# Patient Record
Sex: Female | Born: 1980 | Race: Black or African American | Hispanic: No | Marital: Single | Smoking: Current every day smoker
Health system: Southern US, Community
[De-identification: ages and names within clinical notes are randomized; demographics above are authoritative.]

## PROBLEM LIST (undated history)

## (undated) DIAGNOSIS — B20 Human immunodeficiency virus [HIV] disease: Secondary | ICD-10-CM

## (undated) DIAGNOSIS — D849 Immunodeficiency, unspecified: Secondary | ICD-10-CM

---

## 2006-05-18 ENCOUNTER — Emergency Department (HOSPITAL_COMMUNITY): Admission: EM | Admit: 2006-05-18 | Discharge: 2006-05-18 | Payer: Self-pay | Admitting: Emergency Medicine

## 2009-05-06 ENCOUNTER — Emergency Department (HOSPITAL_COMMUNITY): Admission: EM | Admit: 2009-05-06 | Discharge: 2009-05-07 | Payer: Self-pay | Admitting: Emergency Medicine

## 2009-11-05 ENCOUNTER — Emergency Department (HOSPITAL_COMMUNITY): Admission: EM | Admit: 2009-11-05 | Discharge: 2009-11-05 | Payer: Self-pay | Admitting: Emergency Medicine

## 2011-03-26 ENCOUNTER — Emergency Department (HOSPITAL_COMMUNITY)
Admission: EM | Admit: 2011-03-26 | Discharge: 2011-03-26 | Disposition: A | Discharge status: Home / Self Care | Payer: Self-pay | Attending: Emergency Medicine | Admitting: Emergency Medicine

## 2011-03-26 DIAGNOSIS — K120 Recurrent oral aphthae: Secondary | ICD-10-CM | POA: Insufficient documentation

## 2011-03-26 DIAGNOSIS — Z21 Asymptomatic human immunodeficiency virus [HIV] infection status: Secondary | ICD-10-CM | POA: Insufficient documentation

## 2011-03-26 DIAGNOSIS — L299 Pruritus, unspecified: Secondary | ICD-10-CM | POA: Insufficient documentation

## 2011-03-26 DIAGNOSIS — R21 Rash and other nonspecific skin eruption: Secondary | ICD-10-CM | POA: Insufficient documentation

## 2011-03-26 DIAGNOSIS — R109 Unspecified abdominal pain: Secondary | ICD-10-CM | POA: Insufficient documentation

## 2014-06-22 ENCOUNTER — Encounter (HOSPITAL_COMMUNITY): Payer: Self-pay | Admitting: Emergency Medicine

## 2014-06-22 ENCOUNTER — Emergency Department (HOSPITAL_COMMUNITY): Payer: Self-pay

## 2014-06-22 ENCOUNTER — Emergency Department (HOSPITAL_COMMUNITY)
Admission: EM | Admit: 2014-06-22 | Discharge: 2014-06-22 | Disposition: A | Discharge status: Home / Self Care | Payer: Self-pay | Attending: Emergency Medicine | Admitting: Emergency Medicine

## 2014-06-22 DIAGNOSIS — R05 Cough: Secondary | ICD-10-CM | POA: Insufficient documentation

## 2014-06-22 DIAGNOSIS — R112 Nausea with vomiting, unspecified: Secondary | ICD-10-CM | POA: Insufficient documentation

## 2014-06-22 DIAGNOSIS — Z79899 Other long term (current) drug therapy: Secondary | ICD-10-CM | POA: Insufficient documentation

## 2014-06-22 DIAGNOSIS — M25532 Pain in left wrist: Secondary | ICD-10-CM | POA: Insufficient documentation

## 2014-06-22 DIAGNOSIS — Z792 Long term (current) use of antibiotics: Secondary | ICD-10-CM | POA: Insufficient documentation

## 2014-06-22 DIAGNOSIS — Z720 Tobacco use: Secondary | ICD-10-CM | POA: Insufficient documentation

## 2014-06-22 DIAGNOSIS — M79672 Pain in left foot: Secondary | ICD-10-CM | POA: Insufficient documentation

## 2014-06-22 DIAGNOSIS — B20 Human immunodeficiency virus [HIV] disease: Secondary | ICD-10-CM | POA: Insufficient documentation

## 2014-06-22 HISTORY — DX: Human immunodeficiency virus (HIV) disease: B20

## 2014-06-22 HISTORY — DX: Immunodeficiency, unspecified: D84.9

## 2014-06-22 MED ORDER — HYDROCODONE-ACETAMINOPHEN 5-325 MG PO TABS
2.0000 | ORAL_TABLET | Freq: Once | ORAL | Status: AC
  Administered 2014-06-22: 2 via ORAL
  Filled 2014-06-22: qty 2

## 2014-06-22 MED ORDER — HYDROCODONE-ACETAMINOPHEN 5-325 MG PO TABS: 1.0000 | ORAL_TABLET | Freq: Four times a day (QID) | ORAL | Status: AC | PRN

## 2014-06-22 MED ORDER — HYDROCODONE-ACETAMINOPHEN 5-325 MG PO TABS: 1.0000 | ORAL_TABLET | ORAL | Status: DC | PRN

## 2014-06-22 NOTE — Discharge Instructions (Signed)
Arthralgia °Your caregiver has diagnosed you as suffering from an arthralgia. Arthralgia means there is pain in a joint. This can come from many reasons including: °· Bruising the joint which causes soreness (inflammation) in the joint. °· Wear and tear on the joints which occur as we grow older (osteoarthritis). °· Overusing the joint. °· Various forms of arthritis. °· Infections of the joint. °Regardless of the cause of pain in your joint, most of these different pains respond to anti-inflammatory drugs and rest. The exception to this is when a joint is infected, and these cases are treated with antibiotics, if it is a bacterial infection. °HOME CARE INSTRUCTIONS  °· Rest the injured area for as long as directed by your caregiver. Then slowly start using the joint as directed by your caregiver and as the pain allows. Crutches as directed may be useful if the ankles, knees or hips are involved. If the knee was splinted or casted, continue use and care as directed. If an stretchy or elastic wrapping bandage has been applied today, it should be removed and re-applied every 3 to 4 hours. It should not be applied tightly, but firmly enough to keep swelling down. Watch toes and feet for swelling, bluish discoloration, coldness, numbness or excessive pain. If any of these problems (symptoms) occur, remove the ace bandage and re-apply more loosely. If these symptoms persist, contact your caregiver or return to this location. °· For the first 24 hours, keep the injured extremity elevated on pillows while lying down. °· Apply ice for 15-20 minutes to the sore joint every couple hours while awake for the first half day. Then 03-04 times per day for the first 48 hours. Put the ice in a plastic bag and place a towel between the bag of ice and your skin. °· Wear any splinting, casting, elastic bandage applications, or slings as instructed. °· Only take over-the-counter or prescription medicines for pain, discomfort, or fever as  directed by your caregiver. Do not use aspirin immediately after the injury unless instructed by your physician. Aspirin can cause increased bleeding and bruising of the tissues. °· If you were given crutches, continue to use them as instructed and do not resume weight bearing on the sore joint until instructed. °Persistent pain and inability to use the sore joint as directed for more than 2 to 3 days are warning signs indicating that you should see a caregiver for a follow-up visit as soon as possible. Initially, a hairline fracture (break in bone) may not be evident on X-rays. Persistent pain and swelling indicate that further evaluation, non-weight bearing or use of the joint (use of crutches or slings as instructed), or further X-rays are indicated. X-rays may sometimes not show a small fracture until a week or 10 days later. Make a follow-up appointment with your own caregiver or one to whom we have referred you. A radiologist (specialist in reading X-rays) may read your X-rays. Make sure you know how you are to obtain your X-ray results. Do not assume everything is normal if you do not hear from us. °SEEK MEDICAL CARE IF: °Bruising, swelling, or pain increases. °SEEK IMMEDIATE MEDICAL CARE IF:  °· Your fingers or toes are numb or blue. °· The pain is not responding to medications and continues to stay the same or get worse. °· The pain in your joint becomes severe. °· You develop a fever over 102° F (38.9° C). °· It becomes impossible to move or use the joint. °MAKE SURE YOU:  °·   Understand these instructions.  Will watch your condition.  Will get help right away if you are not doing well or get worse. Document Released: 08/10/2005 Document Revised: 11/02/2011 Document Reviewed: 03/28/2008 [facility] Patient Information 2015 [place], [place]. This information is not intended to replace advice given to you by your health care provider. Make sure you discuss any questions you have with your health care  provider.  AIDS AIDS (Acquired Immune Deficiency Syndrome) is a severe viral infection caused by the Human Immunodeficiency Virus (HIV). This virus destroys a person's resistance to disease and certain cancers. It is transmitted through blood, blood products, and body fluids. Although the fear of AIDS has grown faster than the epidemic, it is important to note that AIDS is not spread by casual contact and is easily killed by hot water, soap, bleach, and most antiseptics. HOW THE AIDS INFECTION WORKS Once HIV enters the body it affects T-helper (T-4) lymphocytes. These are white blood cells that are crucial for the immune system. Once they become infected, they become factories for producing the AIDS Virus. Eventually these infected T-4 cells die. This leaves the victim susceptible to infection and certain cancers. While anyone can get AIDS, you are unlikely to get this disease unless you indulge in high-risk behavior. Some of these high-risk behaviors are promiscuous sex, having close relationships with HIV-positive people, and the sharing of needles. This illness was initially more common in homosexual men, but as time progresses, it will most probably affect an equal number of men and women. Babies born to women who are infected, have greater chances of getting AIDS. Infection with HIV may cause a brief, mild illness with fever several weeks after contact. More serious symptoms do not develop until months or years later, so a person can be infected with the virus without showing any symptoms at all. At this stage of the infection there is no way of knowing you are infected unless you have a blood or mouth scraping test for the AIDS virus. SYMPTOMS   Fevers, night sweats, general weakness, enlarged lymph nodes.  Chest pain, pneumonia, chronic cough, shortness of breath.  Weight loss, diarrhea, difficulty swallowing, rectal problems.  Headaches, personality changes, problems with vision and  memory.  Skin tumors (patchy dark areas) and infections. There is no cure or vaccine for AIDS at the present time. Anti-viral antibiotic drugs have been shown to stop the virus from multiplying, which helps prolong health. The best treatment against AIDS is prevention. If you have been infected with HIV, careful medical follow-up with regular blood tests is necessary.  Do not take part in risky behaviors. These include sharing needles and syringes or other sharp instruments like razors with others, having unprotected sex with high-risk people (homosexuals, bisexuals, or prostitutes), and engaging in anal sex (with or without a condom).  For further information about AIDS, please call your caregiver, the health department, or the Center for Disease Control: 800-342-AIDS. MISCONCEPTIONS ABOUT AIDS  You can not get AIDS through casual contact. This includes sitting next to an AIDS infected person, being coughed on, living with, swimming with, eating food prepared by, sitting or lying next to someone with AIDS.  It is not caught from toilet seats, from showers, bath tubs, water fountains, phones, drinking glasses, or food touched or used by people with AIDS. Casual kissing will probably not transmit the disease. "[place] kissing" (putting one's tongue in another's mouth) is probably not a good idea as the AIDS Virus is present in saliva.  You  will not get AIDS by donating blood. The needles used by blood banks are sterile and disposable.  There is no evidence that AIDS is transmitted through tears.  AIDS is not caught through mosquitoes.  Children with AIDS will not pass AIDS to other children in school without exchange of blood products or engaging in sex. In school, a child with AIDS is actually at greater risk because of their weak immune status. Their susceptibility to the viruses and germs (bacteria) carried by children without AIDS is great. TESTING FOR AIDS  An ELISA (enzyme-linked  immunoabsorbent assay) is a blood test available to let you know if you have contracted AIDS. If this test is positive, it is usually repeated.  If positive a second time, a second test known as the Western blot test is performed. If the Western blot test is positive, it means you have been infected with HIV. PREVENTION  The best way to prevent AIDS is to avoid high-risk behavior.  The outlook for defeating AIDS is good. Millions of research dollars are being spent on creating a vaccine to prevent the disease as well as providing a cure. New drugs appear to be extremely effective at controlling the disease.  Your caregiver will educate you in all the most effective and current treatments. Document Released: 08/07/2000 Document Revised: 11/02/2011 Document Reviewed: 08/03/2008 [facility] Patient Information 2015 [place], [place]. This information is not intended to replace advice given to you by your health care provider. Make sure you discuss any questions you have with your health care provider.

## 2014-06-22 NOTE — ED Notes (Signed)
Bed: [id number] Expected date: 06/22/14 Expected time:  Means of arrival:  Comments: EMS

## 2014-06-22 NOTE — ED Provider Notes (Signed)
CSN: [id number]     Arrival date & time 06/22/14  1739 History   First MD Initiated Contact with Patient 06/22/14 1803     Chief Complaint  Patient presents with  . Generalized Body Aches  . Recurrent Skin Infections     (Consider location/radiation/quality/duration/timing/severity/associated sxs/prior Treatment) HPI Kirsten Russell is a [age] year old female with past medical history of HIV/AIDS who presents to the ER with 3 days of generalized body aches, nausea, vomiting, headache, mildly productive cough. Patient states her symptoms began gradually approximately 3 days ago, and have since worsened. Patient states she has tried ibuprofen 1500 mg every 12 hours with minimal relief. Patient denies having fever, dizziness, syncope, chest pain, shortness of breath, abdominal pain, diarrhea, dysuria. Patient states she was off of antiretrovirals for "several months, and just began taking them one week ago. Patient states her nausea lasted only yesterday, and she vomited times one. Patient states her nausea has improved since then. Patient states her last follow-up with ID was approximately one month ago, and she is unaware of her CD4 or viral load at that time. Patient denies any sexual contacts of any kind within the past "several years". Patient states she has never experienced these symptoms in the past.  Past Medical History  Diagnosis Date  . Immune deficiency disorder   . AIDS    History reviewed. No pertinent past surgical history. No family history on file. History  Substance Use Topics  . Smoking status: Current Every Day Smoker -- 0.50 packs/day    Types: Cigarettes  . Smokeless tobacco: Not on file  . Alcohol Use: Yes     Comment: occasionally   OB History   Grav Para Term Preterm Abortions TAB SAB Ect Mult Living                 Review of Systems  Constitutional: Negative for fever.  HENT: Negative for trouble swallowing.   Eyes: Negative for visual disturbance.   Respiratory: Positive for cough. Negative for shortness of breath.   Cardiovascular: Negative for chest pain.  Gastrointestinal: Positive for nausea and vomiting. Negative for abdominal pain.  Genitourinary: Negative for dysuria.  Musculoskeletal: Negative for neck pain.  Skin: Negative for rash.  Neurological: Negative for dizziness, weakness and numbness.  Psychiatric/Behavioral: Negative.       Allergies  Review of patient's allergies indicates no known allergies.  Home Medications   Prior to Admission medications   Medication Sig Start Date End Date Taking? Authorizing Provider  elvitegravir-cobicistat-emtricitabine-tenofovir (STRIBILD) 150-150-200-300 MG TABS tablet Take 1 tablet by mouth daily with breakfast.   Yes Historical Provider, MD  IBUPROFEN PO Take 1,500 mg by mouth daily as needed (pain).   Yes Historical Provider, MD  azithromycin (ZITHROMAX) 600 MG tablet Take 600 mg by mouth 2 (two) times a week.    Historical Provider, MD  HYDROcodone-acetaminophen (NORCO/VICODIN) 5-325 MG per tablet Take 1 tablet by mouth every 6 (six) hours as needed for moderate pain or severe pain. 06/22/14   Kirsten FantasiaJoseph W Mavis Gravelle, PA-C   BP 110/57  Pulse 78  Temp(Src) 98.9 F (37.2 C) (Oral)  Resp 15  SpO2 98% Physical Exam  Nursing note and vitals reviewed. Constitutional: She is oriented to person, place, and time. She appears well-developed and well-nourished.  Non-toxic appearance. No distress.  Patient uncooperative during the entire exam.  HENT:  Head: Normocephalic and atraumatic.  Mouth/Throat: Oropharynx is clear and moist. No oropharyngeal exudate.  Eyes: Right eye exhibits no discharge. Left eye  exhibits no discharge. No scleral icterus.  Neck: Normal range of motion.  Cardiovascular: Normal rate, regular rhythm and normal heart sounds.   No murmur heard. Pulmonary/Chest: Effort normal and breath sounds normal. No accessory muscle usage. Not tachypneic. No respiratory distress.   Abdominal: Soft. Normal appearance and bowel sounds are normal. There is no tenderness. There is no rigidity, no guarding, no tenderness at McBurney's point and negative Murphy's sign.  Musculoskeletal: Normal range of motion. She exhibits no edema and no tenderness.  Neurological: She is alert and oriented to person, place, and time. No cranial nerve deficit. Coordination normal.  Skin: Skin is warm and dry. No rash noted. She is not diaphoretic.  Psychiatric: She has a normal mood and affect.    ED Course  Procedures (including critical care time) Labs Review Labs Reviewed - No data to display  Imaging Review Dg Wrist Complete Left  06/22/2014   CLINICAL DATA:  Left wrist pain  EXAM: LEFT WRIST - COMPLETE 3+ VIEW  COMPARISON:  None.  FINDINGS: There is no evidence of fracture or dislocation. There is no evidence of arthropathy or other focal bone abnormality. Soft tissues are unremarkable.  IMPRESSION: Negative.   Electronically Signed   By: Signa Kell M.D.   On: 06/22/2014 19:36   Dg Foot Complete Left  06/22/2014   CLINICAL DATA:  Left foot pain for 3 days.  EXAM: LEFT FOOT - COMPLETE 3+ VIEW  COMPARISON:  05/18/2006  FINDINGS: There is no evidence of fracture or dislocation. There is no evidence of arthropathy or other focal bone abnormality. Soft tissues are unremarkable.  IMPRESSION: Negative.   Electronically Signed   By: Myles Rosenthal M.D.   On: 06/22/2014 19:30     EKG Interpretation None      MDM   Final diagnoses:  Left foot pain  Left wrist pain   HIV positive patient complaining of vague symptoms with mild cough, arthralgias which initially began in one joint, have moved to different joints, with no consistent pattern. I noted one sore on the palm of patient's right hand, however no rash noted on patient's palms and soles of her feet. Patient stating she has had no sexual contact in "several years", and I'm not as concerned about a sexually transmitted disease  superimposed on patient's HIV. I also am unable to ascertain anyone source of infection that may be contributing to patient's global symptoms which suggests a viral etiology of patient's complaints.  Patient feeling slightly better after pain medication. X-rays unremarkable for any acute abnormality. I was only able to appreciate one sore on patient's palm, that looked like a blister or mild skin infection without abscess. 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 I do not believe any further workup or lab work will provide [place] with any information tonight. Patient's joints do not appear swollen, erythematous, or showing signs of septic arthritis. Patient afebrile, nontoxic non-tacycardic, nontachypneic, non-hypoxic, in no acute distress. We will discharge patient at this time and have her follow-up with her ID doctor. I strongly encouraged follow-up to patient, and discussed clear return precautions. Patient was agreeable to this plan. I encouraged patient to call or return to the ER should she have any questions or concerns.  BP 110/57  Pulse 78  Temp(Src) 98.9 F (37.2 C) (Oral)  Resp 15  SpO2 98%  Signed,  Kirsten MowJoe Nyree Yonker, PA-C 1:43 AM  This patient seen and discussed with Dr. Purvis SheffieldForrest Harrison, M.D.      Kirsten FantasiaJoseph W Mickell Birdwell, PA-C 06/23/14 [phone]

## 2014-06-22 NOTE — ED Notes (Signed)
Pt sts she has AIDS and has been off of her meds for over two months. [facility] and was afraid someone she knows may see her there, so she stopped going to infectious disease doctor. She sts she went back last week and was put on antivirals and antibiotics which she is not taking due to upset stomach. Pt has small pustules on her left arm and left leg, her lower left leg is swollen, red and feels hot to touch. Pt also c/o "joint pain" in her left arm (wrist, elbow, shoulder). She has sores on palm of her right hand which she sts are painful, but not as painful as her joints. Pt sts unsure if she had fevers.

## 2014-06-22 NOTE — ED Notes (Signed)
Per EMS: [facility] with c/o right hand and left foot infection, EMS  sts multiple sores, pt also reports she is HIV positive, not on meds.

## 2014-06-22 NOTE — ED Notes (Signed)
Ace wrap placed on L wrist and L ankle, pt taken to [facility] via [facility]. Pt verbalized understanding of pain medication and follow up

## 2014-06-23 NOTE — ED Provider Notes (Signed)
Medical screening examination/treatment/procedure(s) were conducted as a shared visit with non-physician practitioner(s) and myself.  I personally evaluated the patient during the encounter.   EKG Interpretation None      I interviewed and examined the patient. Lungs are CTAB. Cardiac exam wnl. Abdomen soft.  Mild to mod non-spec ttp of left wrist and proximal dorsum of left foot. Pt denies fever, AFVSS here, well appearing on exam. Do not think this is septic arthritis given multiple joint involvement and lack of redness/warmth. Unlikely that it is related to an STD ( ex [place] syn), pt denies sex in several years. Plain films non-contrib. Will rec close f/u w/ her ID physician and provide pain control.   Purvis SheffieldForrest Mert Dietrick, MD 06/23/14 [phone]

## 2015-01-13 ENCOUNTER — Encounter (HOSPITAL_COMMUNITY): Payer: Self-pay | Admitting: Emergency Medicine

## 2015-01-13 DIAGNOSIS — Z79899 Other long term (current) drug therapy: Secondary | ICD-10-CM | POA: Insufficient documentation

## 2015-01-13 DIAGNOSIS — B029 Zoster without complications: Secondary | ICD-10-CM | POA: Insufficient documentation

## 2015-01-13 DIAGNOSIS — B20 Human immunodeficiency virus [HIV] disease: Secondary | ICD-10-CM | POA: Insufficient documentation

## 2015-01-13 DIAGNOSIS — K002 Abnormalities of size and form of teeth: Secondary | ICD-10-CM | POA: Insufficient documentation

## 2015-01-13 DIAGNOSIS — K029 Dental caries, unspecified: Secondary | ICD-10-CM | POA: Insufficient documentation

## 2015-01-13 DIAGNOSIS — G8929 Other chronic pain: Secondary | ICD-10-CM | POA: Insufficient documentation

## 2015-01-13 DIAGNOSIS — K088 Other specified disorders of teeth and supporting structures: Secondary | ICD-10-CM | POA: Insufficient documentation

## 2015-01-13 DIAGNOSIS — Z720 Tobacco use: Secondary | ICD-10-CM | POA: Insufficient documentation

## 2015-01-13 DIAGNOSIS — Z862 Personal history of diseases of the blood and blood-forming organs and certain disorders involving the immune mechanism: Secondary | ICD-10-CM | POA: Insufficient documentation

## 2015-01-13 NOTE — ED Notes (Signed)
C/o R upper toothache, R ear pain, and sore throat x 4 days.  Also c/o rash on face and chronic lower back pain.

## 2015-01-14 ENCOUNTER — Emergency Department (HOSPITAL_COMMUNITY)
Admission: EM | Admit: 2015-01-14 | Discharge: 2015-01-14 | Disposition: A | Discharge status: Home / Self Care | Payer: Self-pay | Attending: Emergency Medicine | Admitting: Emergency Medicine

## 2015-01-14 DIAGNOSIS — B20 Human immunodeficiency virus [HIV] disease: Secondary | ICD-10-CM

## 2015-01-14 DIAGNOSIS — K029 Dental caries, unspecified: Secondary | ICD-10-CM

## 2015-01-14 DIAGNOSIS — B029 Zoster without complications: Secondary | ICD-10-CM

## 2015-01-14 DIAGNOSIS — M549 Dorsalgia, unspecified: Secondary | ICD-10-CM

## 2015-01-14 DIAGNOSIS — G8929 Other chronic pain: Secondary | ICD-10-CM

## 2015-01-14 DIAGNOSIS — R21 Rash and other nonspecific skin eruption: Secondary | ICD-10-CM

## 2015-01-14 DIAGNOSIS — Z720 Tobacco use: Secondary | ICD-10-CM

## 2015-01-14 MED ORDER — HYDROCODONE-ACETAMINOPHEN 5-325 MG PO TABS: 1.0000 | ORAL_TABLET | Freq: Four times a day (QID) | ORAL | Status: AC | PRN

## 2015-01-14 MED ORDER — DOXYCYCLINE HYCLATE 100 MG PO CAPS: 100.0000 mg | ORAL_CAPSULE | Freq: Two times a day (BID) | ORAL | Status: AC

## 2015-01-14 MED ORDER — ACYCLOVIR 800 MG PO TABS: 800.0000 mg | ORAL_TABLET | Freq: Every day | ORAL | Status: AC

## 2015-01-14 MED ORDER — HYDROCODONE-ACETAMINOPHEN 5-325 MG PO TABS
1.0000 | ORAL_TABLET | Freq: Once | ORAL | Status: AC
  Administered 2015-01-14: 1 via ORAL
  Filled 2015-01-14: qty 1

## 2015-01-14 NOTE — ED Provider Notes (Signed)
CSN: [id number]     Arrival date & time 01/13/15  2345 History   None    Chief Complaint  Patient presents with  . Dental Pain  . Back Pain     (Consider location/radiation/quality/duration/timing/severity/associated sxs/prior Treatment) HPI Comments: Kirsten Russell is a 34 y.o. 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 She states that the rash developed after she slept on her brothers floor in [place], but also states that she didn't wash her makeup brushes in quite a while and isn't sure if this may have caused it. She denies any pain or itching, simply states that they appear as blisters with clear drainage when they open, without any erythema or warmth. Denies any exposure to similar rashes, exposure to new plants or animals, soaps or detergents, or sick contacts. States that she recently started a new HIV medication that she cannot recall the name of, reporting that she started it 1.5wks ago, but no other new medications recently.   Her tooth pain is located in the right upper back molar #1, where she has known cavities and a filling came out several weeks ago, and she recently noticed a hole in it. The pain is 10/10 constant sharp radiating to the right ear, unrelieved with Goody's powders, ibuprofen, and topical numbing solution, and worsened with cold air or chewing. She also complains of chronic ongoing back pain, but this hasn't changed today. She denies any gum swelling or drainage, oral bleeding, ear drainage, eye pain or discharge, vision changes, sore throat, rhinorrhea, tongue or lip swelling, facial swelling, fevers, chills, headache, dizziness, lightheadedness, chest pain, shortness of breath, wheezing, cough, abdominal pain, nausea, vomiting, dysuria, hematuria, numbness, tingling, weakness, or cauda equina symptoms. She does not have a dentist and smokes daily. [place] in [place], and is complaint with meds. Admits that her CD4 count was low, but can't recall what it was.   Patient is a 34 y.o. female presenting with tooth pain and back pain. The history is provided by the patient. No language interpreter was used.  Dental Pain Location:  Upper Upper teeth location:  1/RU 3rd molar Quality:  Sharp Severity:  Severe Onset quality:  Gradual Duration:  4 days Timing:  Constant Progression:  Unchanged Chronicity:  Recurrent Context: dental caries and filling fell out   Relieved by:  Nothing Worsened by:  Pressure and cold food/drink Ineffective treatments:  NSAIDs and topical anesthetic gel Associated symptoms: no congestion, no drooling, no facial pain, no facial swelling, no fever, no gum swelling, no headaches, no neck pain, no neck swelling, no oral bleeding, no oral lesions and no trismus   Risk factors: immunosuppression, lack of dental care and smoking   Back Pain Associated symptoms: no abdominal pain, no chest pain, no dysuria, no fever, no headaches, no numbness and no weakness     Past Medical History  Diagnosis Date  . Immune deficiency disorder   . AIDS    History reviewed. No pertinent past surgical history. No family history on file. History  Substance Use Topics  . Smoking status: Current Every Day Smoker -- 0.50 packs/day    Types: Cigarettes  . Smokeless tobacco: Not on file  . Alcohol Use: Yes     Comment: occasionally   OB History    No data available     Review of Systems  Constitutional: Negative for fever and chills.  HENT: Positive for dental problem and ear pain (radiating from tooth). Negative for congestion, drooling, ear discharge, facial swelling, mouth sores, rhinorrhea, sinus pressure, sore throat and trouble swallowing.   Eyes: Negative for pain, discharge, itching and visual disturbance.  Respiratory: Negative for shortness of breath.   Cardiovascular: Negative for chest  pain.  Gastrointestinal: Negative for nausea, vomiting, abdominal pain, diarrhea and constipation.  Genitourinary: Negative for dysuria, hematuria, flank pain and difficulty urinating (no incontinence).  Musculoskeletal: Positive for back pain (chronic and unchanged). Negative for myalgias, arthralgias, gait problem and neck pain.  Skin: Positive for rash. Negative for color change.  Allergic/Immunologic: Positive for immunocompromised state (HIV/AIDS).  Neurological: Negative for dizziness, weakness, light-headedness, numbness and headaches.       No tingling  Psychiatric/Behavioral: Negative for confusion.   10 Systems reviewed and are negative for acute change except as noted in the HPI.    Allergies  Review of patient's allergies indicates no known allergies.  Home Medications   Prior to Admission medications   Medication Sig Start Date End Date Taking? Authorizing Provider  azithromycin (ZITHROMAX) 600 MG tablet Take 600 mg by mouth 2 (two) times a week.    Historical Provider, MD  elvitegravir-cobicistat-emtricitabine-tenofovir (STRIBILD) 150-150-200-300 MG TABS tablet Take 1 tablet by mouth daily with breakfast.    Historical Provider, MD  HYDROcodone-acetaminophen (NORCO/VICODIN) 5-325 MG per tablet Take 1 tablet by mouth every 6 (six) hours as needed for moderate pain or severe pain. 06/22/14   Ladona Mow, PA-C  IBUPROFEN PO Take 1,500 mg by mouth daily as needed (pain).    Historical Provider, MD   BP 152/89 mmHg  Pulse 78  Temp(Src) 98 F (36.7 C) (Oral)  Resp 20  Ht 5\' 5"  (1.651 m)  Wt 259 lb 11.2 oz (117.799 kg)  BMI 43.22 kg/m2  SpO2 99% Physical Exam  Constitutional: She is oriented to person, place, and time. Vital signs are normal. She appears well-developed and well-nourished.  Non-toxic appearance. No distress.  Afebrile, nontoxic, NAD  HENT:  Head: Normocephalic and atraumatic. Head is without right periorbital erythema and without left periorbital erythema.    Right Ear: Hearing, tympanic membrane, external ear and ear canal normal.  Left Ear: Hearing, tympanic membrane, external ear and ear canal normal.  Nose: Nose normal.  Mouth/Throat: Uvula is midline, oropharynx is clear and moist and mucous membranes are normal. No trismus in the jaw. Abnormal dentition. Dental caries present. No dental abscesses or uvula swelling.    Vesicular rash to L upper lip and extending towards cheek, no surrounding cellulitis or swelling, nonTTP without induration or warmth.  Ears are clear bilaterally. Nose clear. Oropharynx clear and moist, without uvular swelling or deviation, no trismus or drooling, no tonsillar swelling or erythema, no exudates.   RU molar #1 with decay and TTP, no surrounding erythema or abscess. Diffusely poor dentitia.   Eyes: Conjunctivae and EOM are normal. Pupils are equal, round, and reactive to light. Right eye exhibits no discharge. Left eye exhibits no discharge.  PERRL, EOMI, no nystagmus, no visual field deficits   Neck: Normal range of motion. Neck supple.  Cardiovascular: Normal rate, regular rhythm, normal heart sounds and intact distal pulses.  Exam reveals no gallop and no friction rub.   No murmur heard. Pulmonary/Chest: Effort normal and breath sounds normal. No respiratory distress. She has no decreased breath sounds. She has no wheezes. She has no rhonchi. She has no rales.  Abdominal:  Soft. Normal appearance and bowel sounds are normal. She exhibits no distension. There is no tenderness. There is no rigidity, no rebound, no guarding, no CVA tenderness, no tenderness at McBurney's point and negative Murphy's sign.  Musculoskeletal: Normal range of motion.  MAE x4 Strength and sensation grossly intact Distal pulses intact Gait steady and nonantalgic  Lymphadenopathy:       Head (right side): No submandibular and no tonsillar adenopathy present.       Head (left side): No submandibular and no tonsillar adenopathy present.     She has no cervical adenopathy.  No head/neck LAD  Neurological: She is alert and oriented to person, place, and time. She has normal strength. No sensory deficit. Gait normal.  No facial asymmetry, facial sensation intact  Skin: Skin is warm, dry and intact. Rash noted. Rash is vesicular.  Vesicular rash to R face as noted above  Psychiatric: She has a normal mood and affect.  Nursing note and vitals reviewed.   ED Course  Procedures (including critical care time) Labs Review Labs Reviewed - No data to display  Imaging Review No results found.   EKG Interpretation None      MDM   Final diagnoses:  Facial rash  Shingles outbreak  Pain due to dental caries  Dental decay  Chronic back pain  Human immunodeficiency virus (HIV) disease  Tobacco abuse    34 y.o. female here with right upper molar pain after a filling came out several months ago and she now has a hole in it. No surrounding erythema or abscess, but given her HIV status will treat empirically with doxycycline. We'll give pain medications and refer to dentistry. Also here with vesicular rash along R face, no hutchinson's sign, no ocular symptoms, could be herpes vs shingles. Will start on acyclovir. Pt states additionally that she has chronic back pain which is unchanged, discussed having her f/up with PCP for this chronic issue. Denies sore throat to me, although recorded in nursing note. Oropharynx clear. Will have her f/up with her PCP in 1wk to ensure resolution of symptoms, strict return precautions given. I explained the diagnosis and have given explicit precautions to return to the ER including for any other new or worsening symptoms. The patient understands and accepts the medical plan as it's been dictated and I have answered their questions. Discharge instructions concerning home care and prescriptions have been given. The patient is STABLE and is discharged to home in good condition.  BP 152/89 mmHg  Pulse 78   Temp(Src) 98 F (36.7 C) (Oral)  Resp 20  Ht  (1.651 m)  Wt 259 lb 11.2 oz (117.799 kg)  BMI 43.22 kg/m2  SpO2 99%  Meds ordered this encounter  Medications  . HYDROcodone-acetaminophen (NORCO/VICODIN) 5-325 MG per tablet 1 tablet    Sig:   . HYDROcodone-acetaminophen (NORCO) 5-325 MG per tablet    Sig: Take 1 tablet by mouth every 6 (six) hours as needed for severe pain.    Dispense:  10 tablet    Refill:  0    Order Specific Question:  Supervising Provider    Answer:  Hyacinth Meeker, BRIAN [[id number]]  . acyclovir (ZOVIRAX) 800 MG tablet    Sig: Take 1 tablet (800 mg total) by mouth 5 (five) times daily. x10 days    Dispense:  50 tablet    Refill:  0    Order Specific Question:  Supervising Provider    Answer:  Hyacinth Meeker, BRIAN [[id number]]  . doxycycline (  VIBRAMYCIN) 100 MG capsule    Sig: Take 1 capsule (100 mg total) by mouth 2 (two) times daily. One po bid x 7 days    Dispense:  14 capsule    Refill:  0    Order Specific Question:  Supervising Provider    Answer:  Eber Hong [[id number]]       Azari Hasler Camprubi-Soms, PA-C 01/14/15 0110  Devoria Albe, MD 01/14/15 1610

## 2015-01-14 NOTE — Discharge Instructions (Signed)
Apply warm compresses to jaw throughout the day. Take antibiotic until finished. Take norco as directed, as needed for pain but do not drive or operate machinery with pain medication use. Followup with a dentist is very important for ongoing evaluation and management of recurrent dental pain. Use the list below to find a dentist. For your rash, take acyclovir as directed. Follow up with your regular doctor in 1 week for recheck. If your rash spreads to the tip of your nose, visit your eye doctor immediately. Keep the rash clean and dry. Clean your make up brushes! STOP SMOKING! Return to emergency department for emergent changing or worsening symptoms.   Emergency Department Resource Guide 1) Find a Doctor and Pay Out of Pocket Although you won't have to find out who is covered by your insurance plan, it is a good idea to ask around and get recommendations. You will then need to call the office and see if the doctor you have chosen will accept you as a new patient and what types of options they offer for patients who are self-pay. Some doctors offer discounts or will set up payment plans for their patients who do not have insurance, but you will need to ask so you aren't surprised when you get to your appointment.  2) Contact Your Local Health Department Not all health departments have doctors that can see patients for sick visits, but many do, so it is worth a call to see if yours does. If you don't know where your local health department is, you can check in your phone book. The CDC also has a tool to help you locate your state's health department, and many state websites also have listings of all of their local health departments.  3) Find a Walk-in Clinic If your illness is not likely to be very severe or complicated, you may want to try a walk in clinic. These are popping up all over the country in pharmacies, drugstores, and shopping centers. They're usually staffed by nurse practitioners or  physician assistants that have been trained to treat common illnesses and complaints. They're usually fairly quick and inexpensive. However, if you have serious medical issues or chronic medical problems, these are probably not your best option.  No Primary Care Doctor: - Call Health Connect at  [phone] - they can help you locate a primary care doctor that  accepts your insurance, provides certain services, etc. - Physician Referral Service- [phone]  Chronic Pain Problems: Organization         Address  Phone   Notes  [facility]  [phone] Patients need to be referred by their primary care doctor.   Medication Assistance: Organization         Address  Phone   Notes  [facility] Medication [facility] [address] [place], [place] [address] [phone] --Must be a resident of [facility] -- Must have NO insurance coverage whatsoever (no Medicaid/ Medicare, etc.) -- The pt. MUST have a primary care doctor that directs their care regularly and follows them in the community   [facility]  [phone]   [place]  [phone]     Dental Care: Organization         Address  Phone  Notes  [facility] Department of [facility] [place] [address] [place], [place] [phone] Accepts children up to age 36 who are enrolled in [place] or [place] Health Choice; pregnant women with  a Medicaid card; and children who have applied for Medicaid or [place] Health Choice, but were declined, whose parents can pay a reduced fee at time of service.  [facility] Department of [facility]  [address], [place] [phone] Accepts children up to age 52 who are enrolled in [place] or [place] Health Choice; pregnant women with a Medicaid card; and children who have applied for Medicaid or [place] Health Choice, but were declined, whose parents can pay a reduced fee at time of service.  [facility]  [address] [place], [place] [phone] Patients are seen by appointment only. Walk-ins are not accepted. [facility] will see patients 42 years of age and older. Monday - Tuesday (8am-5pm) Most Wednesdays (8:30-5pm) $30 per visit, cash only  [facility] Adult [facility] Access [facility]  [address], [facility] [phone] Patients are seen by appointment only. Walk-ins are not accepted. [facility] will see patients 62 years of age and older. One Wednesday Evening (Monthly: Volunteer Based).  $30 per visit, cash only  [employer]  [phone] for adults; Children under age 46, call Graduate Pediatric Dentistry at [phone]. Children aged 34-14, please call [phone] to request a pediatric application.  Dental services are provided in all areas of dental care including fillings, crowns and bridges, complete and partial dentures, implants, gum treatment, root canals, and extractions. Preventive care is also provided. Treatment is provided to both adults and children. Patients are selected via a lottery and there is often a waiting list.   [facility] [address], [place]  [phone] [email or web address]   [facility] [address] [place], [place] [phone], Ext. [phone] Second and Fourth Thursday of each month, opens at 6:30 AM; Clinic ends at 9 AM.  Patients are seen on a first-come first-served basis, and a limited number are seen during each clinic.   [facility]  [address] [place], [place] [phone]   Eligibility Requirements You must have lived in [place], [place], or [place] counties for at least the last three months.   You cannot be eligible for state or federal sponsored [place], including [employer], [place], or [employer].   You generally cannot be eligible for healthcare insurance through your employer.    How to apply: Eligibility  screenings are held every Tuesday and Wednesday afternoon from 1:00 pm until 4:00 pm. You do not need an appointment for the interview!  [facility] [address], [place], [place] [phone]   [facility] Department  [phone]   [facility] Health Department  [phone]   [facility] Health Department  [phone]       Dental Caries Dental caries (also called tooth decay) is the most common oral disease. It can occur at any age but is more common in children and young adults.  HOW DENTAL CARIES DEVELOPS  The process of decay begins when bacteria and foods (particularly sugars and starches) combine in your mouth to produce plaque. Plaque is a substance that sticks to the hard, outer surface of a tooth (enamel). The bacteria in plaque produce acids that attack enamel. These acids may also attack the root surface of a tooth (cementum) if it is exposed. Repeated attacks dissolve these surfaces and create holes in the tooth (cavities). If left untreated, the acids destroy the other layers of the tooth.  RISK FACTORS  Frequent sipping of sugary beverages.   Frequent snacking on sugary and starchy foods, especially those that easily get stuck in the teeth.   Poor oral hygiene.   Dry mouth.   Substance abuse such as methamphetamine abuse.   Broken or poor-fitting dental restorations.   Eating disorders.   Gastroesophageal reflux disease (GERD).   Certain radiation treatments to the head and neck. SYMPTOMS In the early stages of dental caries, symptoms are seldom present. Sometimes white, chalky areas may be seen on the enamel or other tooth layers. In later stages, symptoms may include:  Pits and holes on the enamel.  Toothache after sweet, hot, or cold foods or drinks are consumed.  Pain around the tooth.  Swelling around the tooth. DIAGNOSIS  Most of the time, dental caries is detected during a regular dental checkup. A  diagnosis is made after a thorough medical and dental history is taken and the surfaces of your teeth are checked for signs of dental caries. Sometimes special instruments, such as lasers, are used to check for dental caries. Dental X-ray exams may be taken so that areas not visible to the eye (such as between the contact areas of the teeth) can be checked for cavities.  TREATMENT  If dental caries is in its early stages, it may be reversed with a fluoride treatment or an application of a remineralizing agent at the dental office. Thorough brushing and flossing at home is needed to aid these treatments. If it is in its later stages, treatment depends on the location and extent of tooth destruction:   If a small area of the tooth has been destroyed, the destroyed area will be removed and cavities will be filled with a material such as gold, silver amalgam, or composite resin.   If a large area of the tooth has been destroyed, the destroyed area will be removed and a cap (crown) will be fitted over the remaining tooth structure.   If the center part of the tooth (pulp) is affected, a procedure called a root canal will be needed before a filling or crown can be placed.   If most of the tooth has been destroyed, the tooth may need to be pulled (extracted). HOME CARE INSTRUCTIONS You can prevent, stop, or reverse dental caries at home by practicing good oral hygiene. Good oral hygiene includes:  Thoroughly cleaning your teeth at least twice a day with a toothbrush and dental floss.   Using a fluoride toothpaste. A fluoride mouth rinse may also be used if recommended by your dentist or health care provider.   Restricting the amount of sugary and starchy foods and sugary liquids you consume.   Avoiding frequent snacking on these foods and sipping of these liquids.   Keeping regular visits with a dentist for checkups and cleanings. PREVENTION   Practice good oral hygiene.  Consider a dental  sealant. A dental sealant is a coating material that is applied by your dentist to the pits and grooves of teeth. The sealant prevents food from being trapped in them. It may protect the teeth for several years.  Ask about fluoride supplements if you live in a community without fluorinated water or with water that has a low fluoride content. Use fluoride supplements as directed by your dentist or health care provider.  Allow fluoride varnish applications to teeth if directed by your dentist or health care provider. Document Released: 05/02/2002 Document Revised: 12/25/2013 Document Reviewed: 08/12/2012 [facility] Patient Information 2015 [place], [place]. This information  is not intended to replace advice given to you by your health care provider. Make sure you discuss any questions you have with your health care provider.  Dental Pain Toothache is pain in or around a tooth. It may get worse with chewing or with cold or heat.  HOME CARE  Your dentist may use a numbing medicine during treatment. If so, you may need to avoid eating until the medicine wears off. Ask your dentist about this.  Only take medicine as told by your dentist or doctor.  Avoid chewing food near the painful tooth until after all treatment is done. Ask your dentist about this. GET HELP RIGHT AWAY IF:   The problem gets worse or new problems appear.  You have a fever.  There is redness and puffiness (swelling) of the face, jaw, or neck.  You cannot open your mouth.  There is pain in the jaw.  There is very bad pain that is not helped by medicine. MAKE SURE YOU:   Understand these instructions.  Will watch your condition.  Will get help right away if you are not doing well or get worse. Document Released: 01/27/2008 Document Revised: 11/02/2011 Document Reviewed: 01/27/2008 [facility] Patient Information 2015 [place], [place]. This information is not intended to replace advice given to you by your health care  provider. Make sure you discuss any questions you have with your health care provider.  Shingles Shingles is caused by the same virus that causes chickenpox. The first feelings may be pain or tingling. A rash will follow in a couple days. The rash may occur on any area of the body. Long-lasting pain is more likely in an elderly person. It can last months to years. There are medicines that can help prevent pain if you start taking them early. HOME CARE   Take cool baths or place cool cloths on the rash as told by your doctor.  Take medicine only as told by your doctor.  Rest as told by your doctor.  Keep your rash clean with mild soap and cool water or as told by your doctor.  Do not scratch your rash. You may use calamine lotion to relieve itchy skin as told by your doctor.  Keep your rash covered with a loose bandage (dressing).  Avoid touching:  Babies.  Pregnant women.  Children with inflamed skin (eczema).  People who have gotten organ transplants.  People with chronic illnesses, such as leukemia or AIDS.  Wear loose-fitting clothing.  If the rash is on the face, you may need to see a specialist. Keep all appointments. Shingles must be kept away from the eyes, if possible.  Keep all follow-up visits as told by your doctor. GET HELP RIGHT AWAY IF:   You have any pain on the face or eye.  You lose feeling on one side of your face.  You have ear pain or ringing in your ear.  You cannot taste as well.  Your medicines do not help the pain.  Your redness or puffiness (swelling) spreads.  You feel like you are getting worse.  You have a fever. MAKE SURE YOU:   Understand these instructions.  Will watch your condition.  Will get help right away if you are not doing well or get worse. Document Released: 01/27/2008 Document Revised: 12/25/2013 Document Reviewed: 01/27/2008 [facility] Patient Information 2015 [place], [place]. This information is not intended to  replace advice given to you by your health care provider. Make sure you discuss any questions you  have with your health care provider.  Smoking Cessation Quitting smoking is important to your health and has many advantages. However, it is not always easy to quit since nicotine is a very addictive drug. Oftentimes, people try 3 times or more before being able to quit. This document explains the best ways for you to prepare to quit smoking. Quitting takes hard work and a lot of effort, but you can do it. ADVANTAGES OF QUITTING SMOKING  You will live longer, feel better, and live better.  Your body will feel the impact of quitting smoking almost immediately.  Within 20 minutes, blood pressure decreases. Your pulse returns to its normal level.  After 8 hours, carbon monoxide levels in the blood return to normal. Your oxygen level increases.  After 24 hours, the chance of having a heart attack starts to decrease. Your breath, hair, and body stop smelling like smoke.  After 48 hours, damaged nerve endings begin to recover. Your sense of taste and smell improve.  After 72 hours, the body is virtually free of nicotine. Your bronchial tubes relax and breathing becomes easier.  After 2 to 12 weeks, lungs can hold more air. Exercise becomes easier and circulation improves.  The risk of having a heart attack, stroke, cancer, or lung disease is greatly reduced.  After 1 year, the risk of coronary heart disease is cut in half.  After 5 years, the risk of stroke falls to the same as a nonsmoker.  After 10 years, the risk of lung cancer is cut in half and the risk of other cancers decreases significantly.  After 15 years, the risk of coronary heart disease drops, usually to the level of a nonsmoker.  If you are pregnant, quitting smoking will improve your chances of having a healthy baby.  The people you live with, especially any children, will be healthier.  You will have extra money to spend on  things other than cigarettes. QUESTIONS TO THINK ABOUT BEFORE ATTEMPTING TO QUIT You may want to talk about your answers with your health care provider.  Why do you want to quit?  If you tried to quit in the past, what helped and what did not?  What will be the most difficult situations for you after you quit? How will you plan to handle them?  Who can help you through the tough times? Your family? Friends? A health care provider?  What pleasures do you get from smoking? What ways can you still get pleasure if you quit? Here are some questions to ask your health care provider:  How can you help me to be successful at quitting?  What medicine do you think would be best for me and how should I take it?  What should I do if I need more help?  What is smoking withdrawal like? How can I get information on withdrawal? GET READY  Set a quit date.  Change your environment by getting rid of all cigarettes, ashtrays, matches, and lighters in your home, car, or work. Do not let people smoke in your home.  Review your past attempts to quit. Think about what worked and what did not. GET SUPPORT AND ENCOURAGEMENT You have a better chance of being successful if you have help. You can get support in many ways.  Tell your family, friends, and coworkers that you are going to quit and need their support. Ask them not to smoke around you.  Get individual, group, or telephone counseling and support. Programs are available  at local hospitals and health centers. Call your local health department for information about programs in your area.  Spiritual beliefs and practices may help some smokers quit.  Download a "quit meter" on your computer to keep track of quit statistics, such as how long you have gone without smoking, cigarettes not smoked, and money saved.  Get a self-help book about quitting smoking and staying off tobacco. LEARN NEW SKILLS AND BEHAVIORS  Distract yourself from urges to  smoke. Talk to someone, go for a walk, or occupy your time with a task.  Change your normal routine. Take a different route to work. Drink tea instead of coffee. Eat breakfast in a different place.  Reduce your stress. Take a hot bath, exercise, or read a book.  Plan something enjoyable to do every day. Reward yourself for not smoking.  Explore interactive web-based programs that specialize in helping you quit. GET MEDICINE AND USE IT CORRECTLY Medicines can help you stop smoking and decrease the urge to smoke. Combining medicine with the above behavioral methods and support can greatly increase your chances of successfully quitting smoking.  Nicotine replacement therapy helps deliver nicotine to your body without the negative effects and risks of smoking. Nicotine replacement therapy includes nicotine gum, lozenges, inhalers, nasal sprays, and skin patches. Some may be available over-the-counter and others require a prescription.  Antidepressant medicine helps people abstain from smoking, but how this works is unknown. This medicine is available by prescription.  Nicotinic receptor partial agonist medicine simulates the effect of nicotine in your brain. This medicine is available by prescription. Ask your health care provider for advice about which medicines to use and how to use them based on your health history. Your health care provider will tell you what side effects to look out for if you choose to be on a medicine or therapy. Carefully read the information on the package. Do not use any other product containing nicotine while using a nicotine replacement product.  RELAPSE OR DIFFICULT SITUATIONS Most relapses occur within the first 3 months after quitting. Do not be discouraged if you start smoking again. Remember, most people try several times before finally quitting. You may have symptoms of withdrawal because your body is used to nicotine. You may crave cigarettes, be irritable, feel  very hungry, cough often, get headaches, or have difficulty concentrating. The withdrawal symptoms are only temporary. They are strongest when you first quit, but they will go away within 10-14 days. To reduce the chances of relapse, try to:  Avoid drinking alcohol. Drinking lowers your chances of successfully quitting.  Reduce the amount of caffeine you consume. Once you quit smoking, the amount of caffeine in your body increases and can give you symptoms, such as a rapid heartbeat, sweating, and anxiety.  Avoid smokers because they can make you want to smoke.  Do not let weight gain distract you. Many smokers will gain weight when they quit, usually less than 10 pounds. Eat a healthy diet and stay active. You can always lose the weight gained after you quit.  Find ways to improve your mood other than smoking. FOR MORE INFORMATION  www.smokefree.gov  Document Released: 08/04/2001 Document Revised: 12/25/2013 Document Reviewed: 11/19/2011 [facility] Patient Information 2015 [place], [place]. This information is not intended to replace advice given to you by your health care provider. Make sure you discuss any questions you have with your health care provider.

## 2015-05-28 IMAGING — CR DG WRIST COMPLETE 3+V*L*
4 series · 4 of 4 positions shown · non-contrast
Comparison: None.

CLINICAL DATA: Left wrist pain

EXAM:
LEFT WRIST - COMPLETE 3+ VIEW

[x wrist pa left]
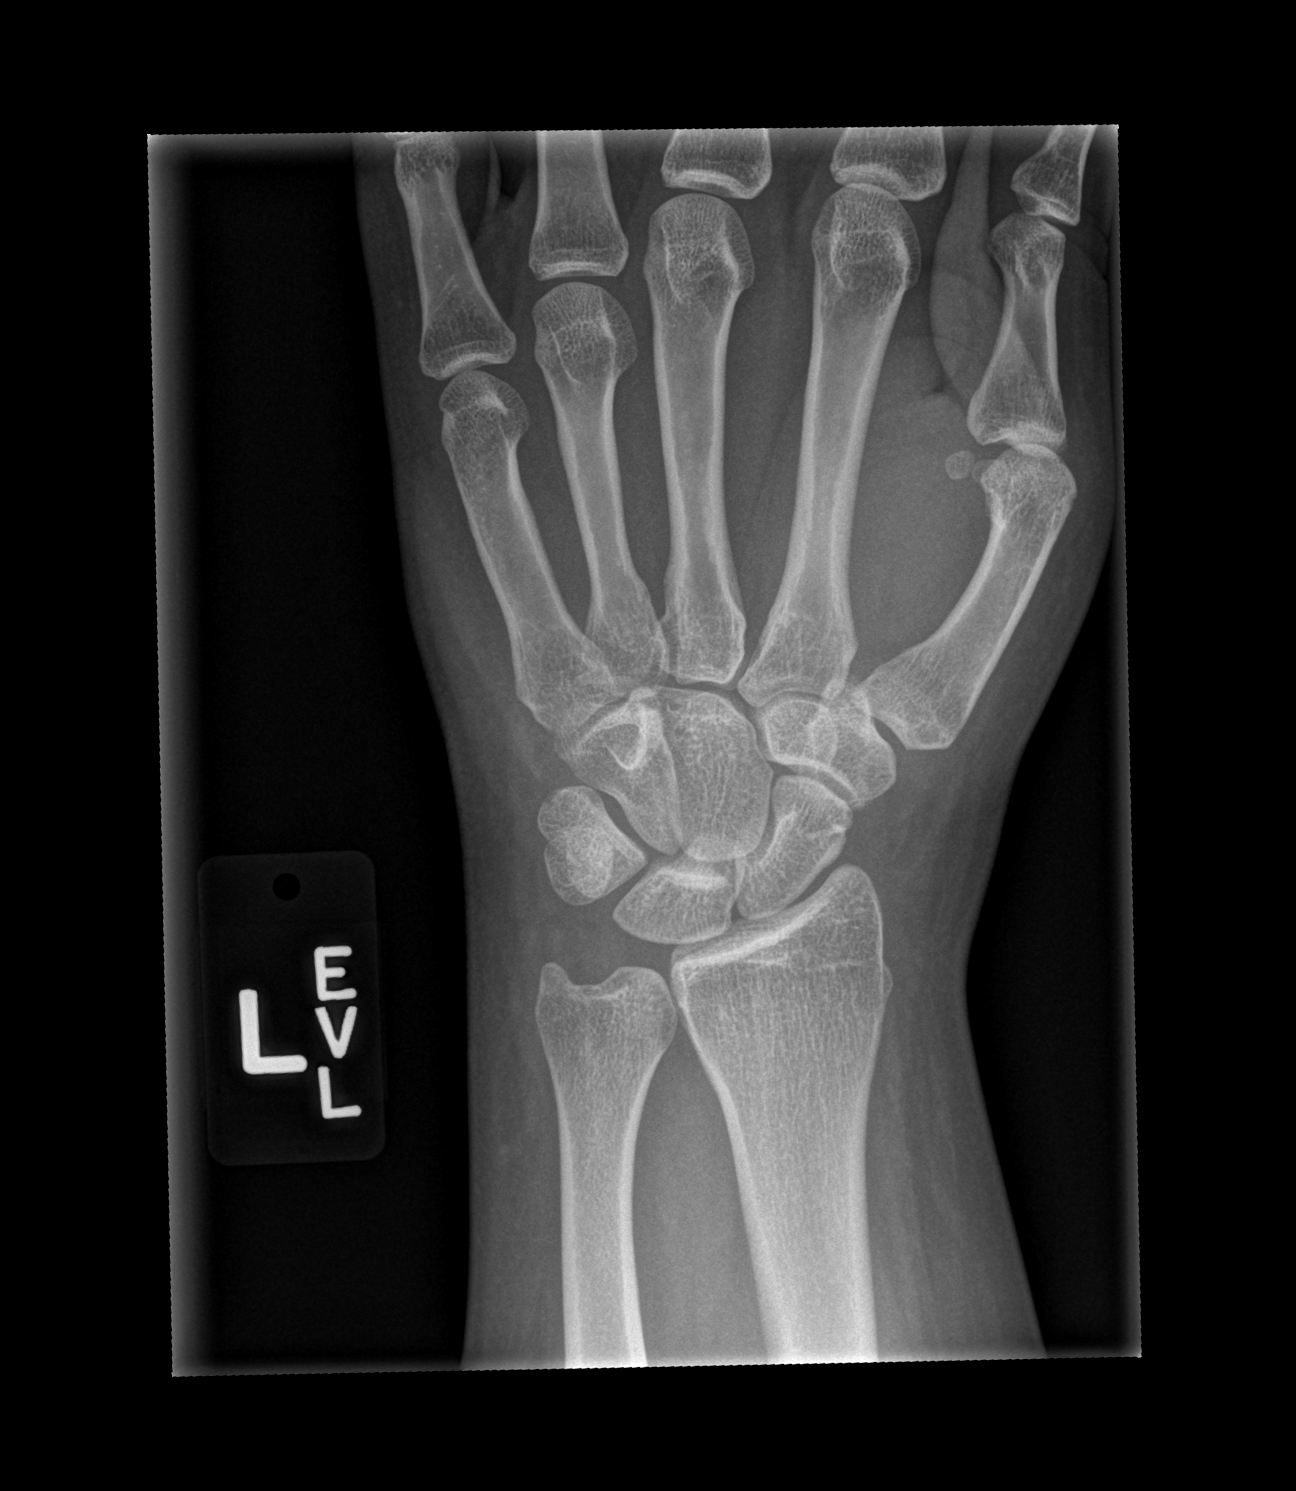

[x wrist obl left]
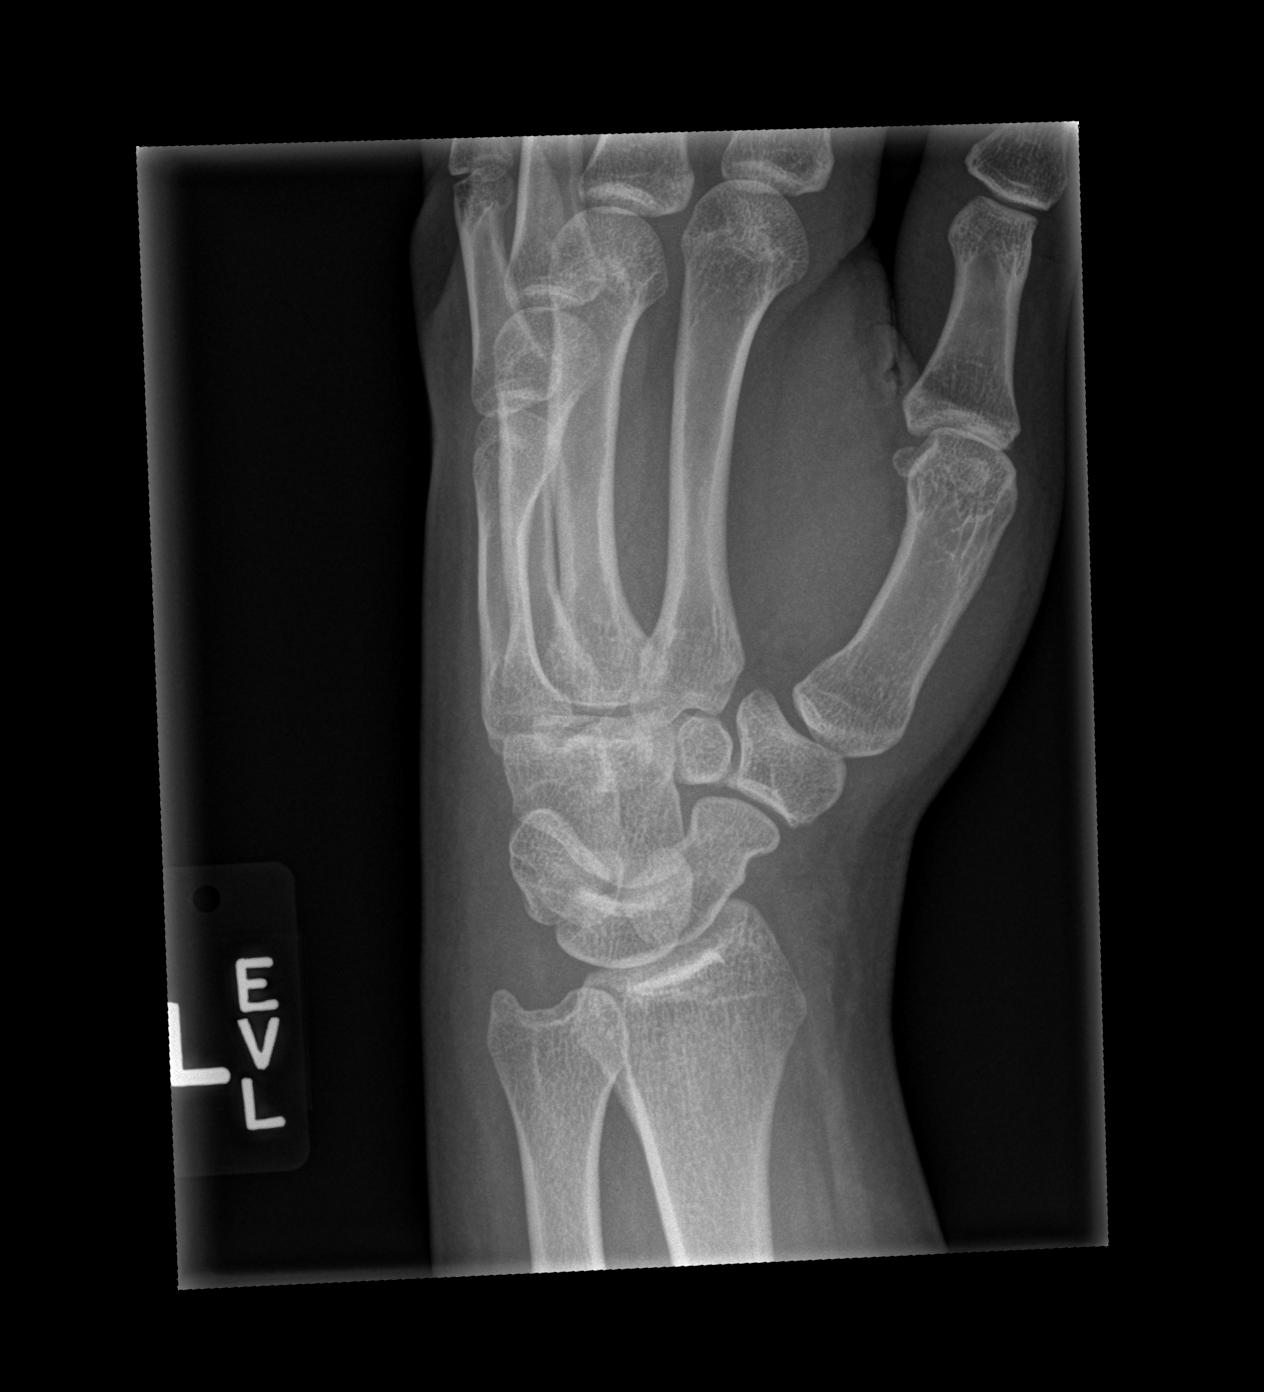

[x wrist lat left]
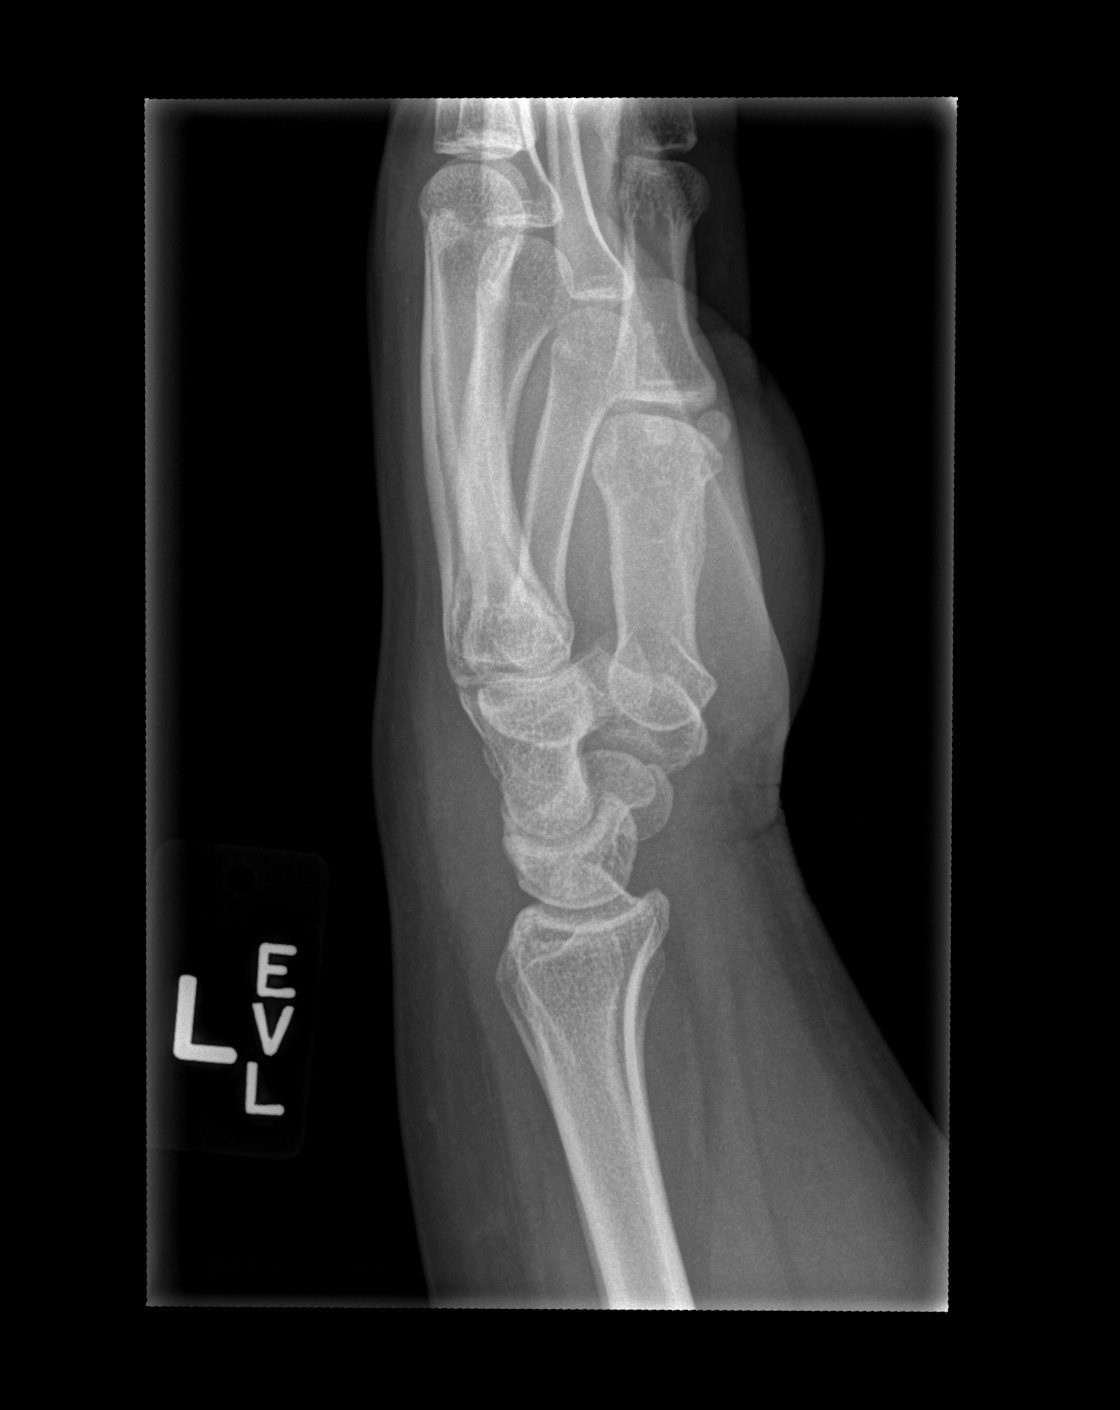

[x wrist navicular view left]
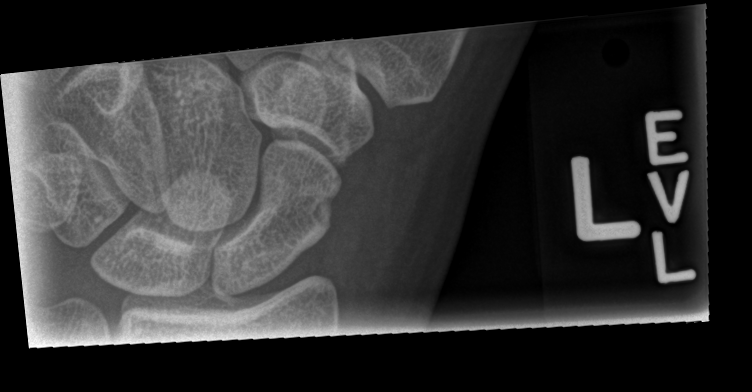

[4 of 4 positions shown; findings below may reference images not displayed]

FINDINGS: There is no evidence of fracture or dislocation. There is no
evidence of arthropathy or other focal bone abnormality. Soft
tissues are unremarkable.
IMPRESSION: Negative.
